# Patient Record
Sex: Female | Born: 1950 | Race: Black or African American | Hispanic: No | Marital: Single | Smoking: Never smoker
Health system: Southern US, Community
[De-identification: ages and names within clinical notes are randomized; demographics above are authoritative.]

## PROBLEM LIST (undated history)

## (undated) DIAGNOSIS — C801 Malignant (primary) neoplasm, unspecified: Secondary | ICD-10-CM

## (undated) DIAGNOSIS — Z9889 Other specified postprocedural states: Secondary | ICD-10-CM

## (undated) HISTORY — PX: APPENDECTOMY: SHX54

## (undated) HISTORY — PX: BREAST SURGERY: SHX581

---

## 2011-12-01 ENCOUNTER — Encounter (HOSPITAL_COMMUNITY): Payer: Self-pay | Admitting: *Deleted

## 2011-12-01 ENCOUNTER — Emergency Department (HOSPITAL_COMMUNITY): Payer: 59

## 2011-12-01 ENCOUNTER — Observation Stay (HOSPITAL_COMMUNITY)
Admission: EM | Admit: 2011-12-01 | Discharge: 2011-12-02 | Disposition: A | Discharge status: Home / Self Care | Payer: 59 | Attending: Emergency Medicine | Admitting: Emergency Medicine

## 2011-12-01 DIAGNOSIS — R0602 Shortness of breath: Secondary | ICD-10-CM | POA: Insufficient documentation

## 2011-12-01 DIAGNOSIS — M6281 Muscle weakness (generalized): Secondary | ICD-10-CM | POA: Insufficient documentation

## 2011-12-01 DIAGNOSIS — R079 Chest pain, unspecified: Principal | ICD-10-CM | POA: Insufficient documentation

## 2011-12-01 DIAGNOSIS — Z9889 Other specified postprocedural states: Secondary | ICD-10-CM | POA: Insufficient documentation

## 2011-12-01 DIAGNOSIS — R112 Nausea with vomiting, unspecified: Secondary | ICD-10-CM | POA: Insufficient documentation

## 2011-12-01 DIAGNOSIS — E785 Hyperlipidemia, unspecified: Secondary | ICD-10-CM | POA: Insufficient documentation

## 2011-12-01 DIAGNOSIS — E119 Type 2 diabetes mellitus without complications: Secondary | ICD-10-CM | POA: Insufficient documentation

## 2011-12-01 HISTORY — DX: Other specified postprocedural states: Z98.890

## 2011-12-01 LAB — CBC
Hemoglobin: 12.8 g/dL (ref 12.0–15.0)
MCH: 27.3 pg (ref 26.0–34.0)
MCV: 86.1 fL (ref 78.0–100.0)
RBC: 4.69 MIL/uL (ref 3.87–5.11)
WBC: 8.5 10*3/uL (ref 4.0–10.5)

## 2011-12-01 LAB — POCT I-STAT TROPONIN I
Troponin i, poc: 0.01 ng/mL (ref 0.00–0.08)
Troponin i, poc: 0.01 ng/mL (ref 0.00–0.08)
Troponin i, poc: 0 ng/mL (ref 0.00–0.08)
Troponin i, poc: 0 ng/mL (ref 0.00–0.08)

## 2011-12-01 LAB — BASIC METABOLIC PANEL
CO2: 24 mEq/L (ref 19–32)
Calcium: 9.9 mg/dL (ref 8.4–10.5)
Chloride: 102 mEq/L (ref 96–112)
Creatinine, Ser: 0.6 mg/dL (ref 0.50–1.10)
Glucose, Bld: 144 mg/dL — ABNORMAL HIGH (ref 70–99)

## 2011-12-01 LAB — GLUCOSE, CAPILLARY: Glucose-Capillary: 53 mg/dL — ABNORMAL LOW (ref 70–99)

## 2011-12-01 MED ORDER — NITROGLYCERIN 0.4 MG SL SUBL
SUBLINGUAL_TABLET | SUBLINGUAL | Status: AC
  Administered 2011-12-01: 0.4 mg via SUBLINGUAL
  Filled 2011-12-01: qty 75

## 2011-12-01 MED ORDER — ACETAMINOPHEN 325 MG PO TABS
ORAL_TABLET | ORAL | Status: AC
  Filled 2011-12-01: qty 2

## 2011-12-01 MED ORDER — ACETAMINOPHEN 325 MG PO TABS
650.0000 mg | ORAL_TABLET | Freq: Once | ORAL | Status: AC
  Administered 2011-12-01: 650 mg via ORAL
  Filled 2011-12-01: qty 2

## 2011-12-01 MED ORDER — ASPIRIN 81 MG PO CHEW
324.0000 mg | CHEWABLE_TABLET | Freq: Once | ORAL | Status: AC
  Administered 2011-12-01: 324 mg via ORAL
  Filled 2011-12-01: qty 4

## 2011-12-01 MED ORDER — ACETAMINOPHEN 325 MG PO TABS
650.0000 mg | ORAL_TABLET | Freq: Once | ORAL | Status: AC
  Administered 2011-12-01: 650 mg via ORAL

## 2011-12-01 MED ORDER — ONDANSETRON HCL 4 MG/2ML IJ SOLN
INTRAMUSCULAR | Status: AC
  Administered 2011-12-01: 4 mg via INTRAVENOUS
  Filled 2011-12-01: qty 2

## 2011-12-01 MED ORDER — METOPROLOL TARTRATE 25 MG PO TABS
100.0000 mg | ORAL_TABLET | Freq: Once | ORAL | Status: AC
  Administered 2011-12-02: 100 mg via ORAL
  Filled 2011-12-01: qty 4
  Filled 2011-12-01 (×2): qty 2

## 2011-12-01 MED ORDER — NITROGLYCERIN 0.4 MG SL SUBL
0.4000 mg | SUBLINGUAL_TABLET | SUBLINGUAL | Status: AC | PRN
  Administered 2011-12-01 (×3): 0.4 mg via SUBLINGUAL

## 2011-12-01 NOTE — ED Provider Notes (Signed)
History     CSN: [id number]  Arrival date & time 12/01/11  1006   First MD Initiated Contact with Patient 12/01/11 1015      Chief Complaint  Patient presents with  . Chest Pain  . Emesis    (Consider location/radiation/quality/duration/timing/severity/associated sxs/prior treatment) The history is provided by the patient.   61 year old female with a past medical history of diabetes and hyperlipidemia who presents to the emergency department with a chief complaint of chest pain and emesis. Nausea this morning with dry heaves at home followed by 3 episodes of non-bloody emesis at work and resultant substernal chest pressure described as "better if I could just get a burp out". There was associated generalized weakness, continued nausea, and diaphoresis. She is unable to determine if the pain was exertional. Nonpleuritic. There was no radiation. A nitroglycerin by EMS with resolution of her pain. At the time of examination, patient complains of fatigue but denies further complaints. No cardiac testing and her history. No known personal cardiac disease, her risk factors listed above are diabetes and hyperlipidemia. Her family history is significant for diabetes, but she denies any known family history of coronary disease.  Past Medical History  Diagnosis Date  . Diabetes mellitus   . S/P lumpectomy, left breast     History reviewed. No pertinent past surgical history.  No family history on file.  History  Substance Use Topics  . Smoking status: Not on file  . Smokeless tobacco: Not on file  . Alcohol Use: No     Review of Systems 10 systems reviewed and are negative for acute change except as noted in the HPI.\  Allergies  Review of patient's allergies indicates no known allergies.  Home Medications   Current Outpatient Rx  Name Route Sig Dispense Refill  . GLIPIZIDE 5 MG PO TABS Oral Take 5 mg by mouth 2 (two) times daily before a meal.    . METFORMIN HCL 500 MG PO TABS  Oral Take 1,000 mg by mouth 2 (two) times daily with a meal.    . PRAVASTATIN SODIUM 20 MG PO TABS Oral Take 10 mg by mouth daily.       BP 118/61  Pulse 88  Temp 98.8 F (37.1 C) (Oral)  Resp 18  SpO2 97%  Physical Exam  Nursing note reviewed. Constitutional: She appears well-developed and well-nourished.       Vital signs are reviewed and are normal. Pt resting on stretcher in no distress, appears tired.  HENT:  Head: Normocephalic and atraumatic.       MMM  Eyes: Conjunctivae are normal. Pupils are equal, round, and reactive to light.  Neck: Neck supple.  Cardiovascular: Normal rate, regular rhythm and normal heart sounds.   No murmur heard.      Bilateral radial and DP pulses are 2+   Pulmonary/Chest: Effort normal and breath sounds normal. No respiratory distress. She has no wheezes. She has no rales.  Abdominal: Soft. Bowel sounds are normal. She exhibits no distension. There is no tenderness.  Musculoskeletal: She exhibits no edema.       Calves supple and non-tender  Neurological: She is alert.       MAEW with 5/5 strength to BUE and BLE  Skin: Skin is warm and dry. She is not diaphoretic.  Psychiatric: She has a normal mood and affect.    ED Course  Procedures (including critical care time)  Labs Reviewed  BASIC METABOLIC PANEL - Abnormal; Notable for the following:  Glucose, Bld 144 (*)     All other components within normal limits  GLUCOSE, CAPILLARY - Abnormal; Notable for the following:    Glucose-Capillary 144 (*)     All other components within normal limits  GLUCOSE, CAPILLARY - Abnormal; Notable for the following:    Glucose-Capillary 53 (*)     All other components within normal limits  POCT I-STAT TROPONIN I  CBC  POCT I-STAT TROPONIN I  POCT I-STAT TROPONIN I   Dg Chest 2 View  12/01/2011  *RADIOLOGY REPORT*  Clinical Data: Chest pain, shortness of breath.  CHEST - 2 VIEW  Comparison: None.  Findings: Low lung volumes with minimal bibasilar  atelectasis. Heart is normal size, accentuated by the low volumes.  No effusions or acute bony abnormality.  IMPRESSION: Low lung volumes, bibasilar atelectasis.  Original Report Authenticated By: Cyndie Chime, M.D.       MDM  Pt with CP, resolved PTA, no recurrence in ED. RF DM, HLD. Troponin neg x 2. EKG nml, please see attending MD note. CXR with low lung volumes, no finding to explain sx. Good improvement of both nausea and fatigue in ED. TIMI 0, HEART score 2, pt is a good candidate for chest pain protocol. BMI <35, will plan for coronary CT. Plan for test in am, will give dose of metoprolol now to test response and determine if CT will be feasible tomorrow. Low blood sugar on last check, pt given meal tray. CDU provider Pisciotta and Lauralee Evener have been given report on this patient and will continue to monitor on evening shift.  Shaaron Adler, [place] 12/01/11 1526

## 2011-12-01 NOTE — ED Notes (Signed)
Reports h/a since administration of NTG - Tylenol administered as ordered

## 2011-12-01 NOTE — ED Provider Notes (Signed)
Pt arrived in CDU comfortably for w/o of chest pain protocol pending cardiac CT in AM tomorow. Pt pain free currently. Will follow closely  2300 - Pt resting comfortably. Denies Pain, N/V, SOB  Wynetta Emery, PA-C 12/01/11 2321

## 2011-12-01 NOTE — ED Notes (Signed)
Patient is resting comfortably. 

## 2011-12-01 NOTE — ED Notes (Signed)
CBG was 177 per EMS.

## 2011-12-01 NOTE — ED Notes (Signed)
Pt. cbg 144mg 

## 2011-12-01 NOTE — Progress Notes (Signed)
2:31 PM Medical screening examination/treatment/procedure(s) were conducted as a shared visit with non-physician practitioner(s) and myself.  I personally evaluated the patient during the encounter 61 yo woman who had precordial chest pain this AM.  Her exam is good.  Her EKG and initial TNI are negative.  I advised her that she had not had a heart attack, but could potentially have narrowing of coronary arteries to produce her symptoms.  Advised to be observed in CDU OBS for chest pain protocol.

## 2011-12-01 NOTE — ED Provider Notes (Signed)
10:35 AM  Date: 12/01/2011  Rate: 83  Rhythm: normal sinus rhythm  QRS Axis: normal  Intervals: normal  ST/T Wave abnormalities: normal  Conduction Disutrbances:none  Narrative Interpretation: Normal EKG.  Old EKG Reviewed: none available    Carleene Cooper III, MD 12/01/11 [phone]

## 2011-12-01 NOTE — ED Notes (Signed)
Pain free at this time; ambulatory to restroom without event

## 2011-12-01 NOTE — ED Provider Notes (Signed)
Medical screening examination/treatment/procedure(s) were conducted as a shared visit with non-physician practitioner(s) and myself.  I personally evaluated the patient during the encounter  2:31 PM  Medical screening examination/treatment/procedure(s) were conducted as a shared visit with non-physician practitioner(s) and myself. I personally evaluated the patient during the encounter  61 yo woman who had precordial chest pain this AM. Her exam is good. Her EKG and initial TNI are negative. I advised her that she had not had a heart attack, but could potentially have narrowing of coronary arteries to produce her symptoms. Advised to be observed in CDU OBS for chest pain protocol.        Carleene Cooper III, MD 12/01/11 2102

## 2011-12-01 NOTE — ED Notes (Signed)
PA-C made aware of low CBG.

## 2011-12-01 NOTE — ED Notes (Signed)
Pt. Has c/o "dry heaving at home." At work pt. Had c/o vomiting at work and started with c/o chest pain.  Pt. Has had c/o chest pain that was 8/10 and has decreased but continues to "linger."  Pt. Received with SL Nitro and had pain relief.  Pt. Reports that she just "feels different."

## 2011-12-01 NOTE — ED Notes (Signed)
States pain remains 3/10 after NTG x1; ccm showing SR rate 88 without ectopy

## 2011-12-02 ENCOUNTER — Observation Stay (HOSPITAL_COMMUNITY): Payer: 59

## 2011-12-02 ENCOUNTER — Observation Stay (HOSPITAL_COMMUNITY): Admit: 2011-12-02 | Payer: 59

## 2011-12-02 LAB — GLUCOSE, CAPILLARY: Glucose-Capillary: 187 mg/dL — ABNORMAL HIGH (ref 70–99)

## 2011-12-02 MED ORDER — NITROGLYCERIN 0.4 MG SL SUBL
SUBLINGUAL_TABLET | SUBLINGUAL | Status: AC
  Administered 2011-12-02: 0.4 mg via SUBLINGUAL
  Filled 2011-12-02: qty 25

## 2011-12-02 MED ORDER — METOPROLOL TARTRATE 1 MG/ML IV SOLN
5.0000 mg | Freq: Once | INTRAVENOUS | Status: AC
  Administered 2011-12-02: 5 mg via INTRAVENOUS

## 2011-12-02 MED ORDER — METOPROLOL TARTRATE 25 MG PO TABS
100.0000 mg | ORAL_TABLET | Freq: Once | ORAL | Status: AC
  Administered 2011-12-02: 100 mg via ORAL
  Filled 2011-12-02: qty 4

## 2011-12-02 MED ORDER — NITROGLYCERIN 0.4 MG SL SUBL
0.4000 mg | SUBLINGUAL_TABLET | Freq: Once | SUBLINGUAL | Status: AC
  Administered 2011-12-02: 0.4 mg via SUBLINGUAL

## 2011-12-02 MED ORDER — METOPROLOL TARTRATE 1 MG/ML IV SOLN
INTRAVENOUS | Status: AC
  Administered 2011-12-02: 5 mg via INTRAVENOUS
  Filled 2011-12-02: qty 15

## 2011-12-02 MED ORDER — METOPROLOL TARTRATE 1 MG/ML IV SOLN
10.0000 mg | Freq: Once | INTRAVENOUS | Status: AC
  Administered 2011-12-02: 10 mg via INTRAVENOUS

## 2011-12-02 MED ORDER — IOHEXOL 350 MG/ML SOLN
80.0000 mL | Freq: Once | INTRAVENOUS | Status: AC | PRN
  Administered 2011-12-02: 80 mL via INTRAVENOUS

## 2011-12-02 MED ORDER — ONDANSETRON HCL 4 MG/2ML IJ SOLN
4.0000 mg | Freq: Once | INTRAMUSCULAR | Status: AC
  Administered 2011-12-02: 4 mg via INTRAVENOUS
  Filled 2011-12-02: qty 2

## 2011-12-02 MED ORDER — MORPHINE SULFATE 4 MG/ML IJ SOLN
4.0000 mg | Freq: Once | INTRAMUSCULAR | Status: AC
  Administered 2011-12-02: 4 mg via INTRAVENOUS
  Filled 2011-12-02: qty 1

## 2011-12-02 NOTE — ED Notes (Signed)
Patient is resting comfortably. 

## 2011-12-02 NOTE — ED Provider Notes (Signed)
8:03 AM Patient is in CDU under observation, chest pain protocol.  This is a shared visit with Dr Ignacia Palma.  Patient presented with CP, vomiting.  PMH hyperlipidemia, DM.  Pt denies CP currently, states she had a headache overnight that has now resolved.  On exam, pt is A&Ox4, NAD, RRR, no m/r/g, CTAB, abd soft, NT, extremities without edema, distal pulses intact and equal bilaterally.  Plan is for Coronary CT this morning.  PC[facility] is [facility].  11:03 AM Patient is asymptomatic at present.  Coronary CT shows no CAD.  Discussed results with patient and family.  Pt to be d/c home, PCP follow up.  Patient verbalizes understanding and agrees with plan.     Results for orders placed during the hospital encounter of 12/01/11  BASIC METABOLIC PANEL      Component Value Range   Sodium 138  135 - 145 mEq/L   Potassium 4.0  3.5 - 5.1 mEq/L   Chloride 102  96 - 112 mEq/L   CO2 24  19 - 32 mEq/L   Glucose, Bld 144 (*) 70 - 99 mg/dL   BUN 14  6 - 23 mg/dL   Creatinine, Ser 4.54  0.50 - 1.10 mg/dL   Calcium 9.9  8.4 - 09.8 mg/dL   GFR calc non Af Amer >90  >90 mL/min   GFR calc Af Amer >90  >90 mL/min  GLUCOSE, CAPILLARY      Component Value Range   Glucose-Capillary 144 (*) 70 - 99 mg/dL  POCT I-STAT TROPONIN I      Component Value Range   Troponin i, poc 0.01  0.00 - 0.08 ng/mL   Comment 3           CBC      Component Value Range   WBC 8.5  4.0 - 10.5 K/uL   RBC 4.69  3.87 - 5.11 MIL/uL   Hemoglobin 12.8  12.0 - 15.0 g/dL   HCT 11.9  14.7 - 82.9 %   MCV 86.1  78.0 - 100.0 fL   MCH 27.3  26.0 - 34.0 pg   MCHC 31.7  30.0 - 36.0 g/dL   RDW 56.2  13.0 - 86.5 %   Platelets 234  150 - 400 K/uL  POCT I-STAT TROPONIN I      Component Value Range   Troponin i, poc 0.01  0.00 - 0.08 ng/mL   Comment 3           POCT I-STAT TROPONIN I      Component Value Range   Troponin i, poc 0.00  0.00 - 0.08 ng/mL   Comment 3           GLUCOSE, CAPILLARY      Component Value Range   Glucose-Capillary 53 (*) 70 - 99 mg/dL  POCT I-STAT TROPONIN I      Component Value Range   Troponin i, poc 0.00  0.00 - 0.08 ng/mL   Comment 3            Dg Chest 2 View  12/01/2011  *RADIOLOGY REPORT*  Clinical Data: Chest pain, shortness of breath.  CHEST - 2 VIEW  Comparison: None.  Findings: Low lung volumes with minimal bibasilar atelectasis. Heart is normal size, accentuated by the low volumes.  No effusions or acute bony abnormality.  IMPRESSION: Low lung volumes, bibasilar atelectasis.  Original Report Authenticated By: Cyndie Chime, M.D.   Ct Coronary Morp W/cta Cor W/score W/ca W/cm &/or Wo/cm  12/02/2011  *RADIOLOGY REPORT*  INDICATION:  Chest pain.  Coronary artery disease.  CT ANGIOGRAPHY OF THE HEART, CORONARY ARTERY, STRUCTURE, AND MORPHOLOGY  CONTRAST: 80mL OMNIPAQUE IOHEXOL 350 MG/ML SOLN  COMPARISON:  Chest radiograph 12/01/2011.  TECHNIQUE:  CT angiography of the coronary vessels was performed on a 256 channel system using prospective ECG gating.  A scout and noncontrast exam (for calcium scoring) were performed.  Circulation time was measured using a test bolus.  Coronary CTA was performed with sub mm slice collimation during portions of the cardiac cycle after prior injection of iodinated contrast.  Imaging post processing was performed on an independent workstation creating multiplanar and 3-D images, and quantitative analysis of the heart and coronary arteries.  Note that this exam targets the heart and the chest was not imaged in its entirety.  PREMEDICATION: Lopressor 200 mg, P.O. Lopressor one mg, IV Nitroglycerin 0.4 mcg, sublingual.  FINDINGS: Technical quality:  Excellent  Heart rate:  59  CORONARY ARTERIES: Left main coronary artery:  Widely patent. Left anterior descending:  Tiny area of myocardial bridging is present in the mid to distal left anterior descending coronary artery.  No stenosis.  Left anterior descending is widely patent. Left circumflex:  Diminutive but  widely patent. Right coronary artery:  Widely patent. Posterior descending artery:  Large PDA is widely patent. Dominance:  Right  CORONARY CALCIUM: Total Agatston Score:  Zero  CARDIAC MEASUREMENTS: Interventricular septum (6 - 12 mm):  10 mm LV posterior wall (6 - 12 mm):  9 mm LV diameter in diastole (35 - 52 mm):  45 mm  AORTA AND PULMONARY MEASUREMENTS: Aortic root (21 - 40 mm):             20 mm  at the annulus             32 mm  at the sinuses of Valsalva              22 mm  at the sinotubular junction Ascending aorta ( <  40 mm):  31 mm Descending aorta ( <  40 mm):  22 mm Main pulmonary artery:  ( <  30 mm):  22 mm  EXTRACARDIAC FINDINGS: Mild thoracic spondylosis.  No aggressive osseous lesions. Visualized pulmonary arteries appear within normal limits.  No pericardial effusion.  Incidental imaging the upper abdomen is normal.  Normal appearance of the distal thoracic esophagus. Lingular and dependent atelectasis in the lungs.  No airspace disease.  IMPRESSION: 1.  No coronary artery disease.  The patient's total coronary artery calcium score is zero. 2.  Coronary arteries widely patent. 3.  Right coronary artery dominance.  Report was called to [facility], [place] at 1052 hours on 12/02/2011.  Original Report Authenticated By: Andreas Newport, M.D.      Rise Patience, [place] 12/02/11 1104   **Repeat CBG was 187 prior to discharge.  Rise Patience, [place] 12/02/11 1106

## 2011-12-02 NOTE — Discharge Instructions (Signed)
Read the information below.  Please call your primary care provider today to schedule a close follow up appointment.  You may return to the ER at any time for worsening condition or any new symptoms that concern you.

## 2011-12-02 NOTE — ED Provider Notes (Signed)
Medical screening examination/treatment/procedure(s) were conducted as a shared visit with non-physician practitioner(s) and myself.  I personally evaluated the patient during the encounter Pt seen by me yesterday, placed in Chest Pain protocol.  Her coronary CT was negative, so pt was released to followup with her PCP.  Carleene Cooper III, MD 12/02/11 [phone]

## 2011-12-02 NOTE — Progress Notes (Signed)
Observation review is complete. 

## 2011-12-02 NOTE — ED Notes (Signed)
Vital signs stable. 

## 2011-12-02 NOTE — ED Notes (Signed)
SPOKE WITH DR Carlota Raspberry ABOUT PT HEART RATE. HE HAS ORDERED REPEAT METOPTOLOL FOR PT TO ACHIEVE TARGET HEART RATE FOR HER CT CARD.

## 2011-12-02 NOTE — ED Provider Notes (Signed)
Medical screening examination/treatment/procedure(s) were performed by non-physician practitioner and as supervising physician I was immediately available for consultation/collaboration.   Lark Runk A Stefano Trulson, MD 12/02/11 0002 

## 2016-07-22 ENCOUNTER — Encounter (HOSPITAL_BASED_OUTPATIENT_CLINIC_OR_DEPARTMENT_OTHER): Payer: Self-pay | Admitting: *Deleted

## 2016-07-22 ENCOUNTER — Emergency Department (HOSPITAL_BASED_OUTPATIENT_CLINIC_OR_DEPARTMENT_OTHER)
Admission: EM | Admit: 2016-07-22 | Discharge: 2016-07-22 | Disposition: A | Discharge status: Home / Self Care | Payer: 59 | Attending: Emergency Medicine | Admitting: Emergency Medicine

## 2016-07-22 ENCOUNTER — Emergency Department (HOSPITAL_BASED_OUTPATIENT_CLINIC_OR_DEPARTMENT_OTHER): Payer: 59

## 2016-07-22 DIAGNOSIS — Z79899 Other long term (current) drug therapy: Secondary | ICD-10-CM | POA: Insufficient documentation

## 2016-07-22 DIAGNOSIS — Z7984 Long term (current) use of oral hypoglycemic drugs: Secondary | ICD-10-CM | POA: Insufficient documentation

## 2016-07-22 DIAGNOSIS — Z859 Personal history of malignant neoplasm, unspecified: Secondary | ICD-10-CM | POA: Insufficient documentation

## 2016-07-22 DIAGNOSIS — R1012 Left upper quadrant pain: Secondary | ICD-10-CM | POA: Diagnosis present

## 2016-07-22 DIAGNOSIS — R110 Nausea: Secondary | ICD-10-CM | POA: Diagnosis not present

## 2016-07-22 DIAGNOSIS — E119 Type 2 diabetes mellitus without complications: Secondary | ICD-10-CM | POA: Insufficient documentation

## 2016-07-22 DIAGNOSIS — R1013 Epigastric pain: Secondary | ICD-10-CM | POA: Diagnosis not present

## 2016-07-22 HISTORY — DX: Malignant (primary) neoplasm, unspecified: C80.1

## 2016-07-22 LAB — URINALYSIS, ROUTINE W REFLEX MICROSCOPIC
Bilirubin Urine: NEGATIVE
Hgb urine dipstick: NEGATIVE
Ketones, ur: 15 mg/dL — AB
LEUKOCYTES UA: NEGATIVE
NITRITE: NEGATIVE
PH: 5 (ref 5.0–8.0)
Protein, ur: NEGATIVE mg/dL

## 2016-07-22 LAB — CBC WITH DIFFERENTIAL/PLATELET
BASOS PCT: 0 %
Basophils Absolute: 0 10*3/uL (ref 0.0–0.1)
EOS ABS: 0.1 10*3/uL (ref 0.0–0.7)
Eosinophils Relative: 1 %
HEMATOCRIT: 42.3 % (ref 36.0–46.0)
Hemoglobin: 13.5 g/dL (ref 12.0–15.0)
LYMPHS ABS: 1.1 10*3/uL (ref 0.7–4.0)
Lymphocytes Relative: 9 %
MCH: 27.3 pg (ref 26.0–34.0)
MCHC: 31.9 g/dL (ref 30.0–36.0)
MCV: 85.6 fL (ref 78.0–100.0)
MONOS PCT: 5 %
Monocytes Absolute: 0.6 10*3/uL (ref 0.1–1.0)
NEUTROS ABS: 10.6 10*3/uL — AB (ref 1.7–7.7)
NEUTROS PCT: 85 %
Platelets: 357 10*3/uL (ref 150–400)
RBC: 4.94 MIL/uL (ref 3.87–5.11)
RDW: 13.4 % (ref 11.5–15.5)
WBC: 12.3 10*3/uL — ABNORMAL HIGH (ref 4.0–10.5)

## 2016-07-22 LAB — COMPREHENSIVE METABOLIC PANEL
ALBUMIN: 4.2 g/dL (ref 3.5–5.0)
ALK PHOS: 53 U/L (ref 38–126)
ALT: 13 U/L — ABNORMAL LOW (ref 14–54)
ANION GAP: 8 (ref 5–15)
AST: 13 U/L — ABNORMAL LOW (ref 15–41)
BILIRUBIN TOTAL: 0.7 mg/dL (ref 0.3–1.2)
BUN: 16 mg/dL (ref 6–20)
CALCIUM: 9.5 mg/dL (ref 8.9–10.3)
CO2: 26 mmol/L (ref 22–32)
CREATININE: 0.58 mg/dL (ref 0.44–1.00)
Chloride: 102 mmol/L (ref 101–111)
GFR calc Af Amer: >60 mL/min (ref >60)
GFR calc non Af Amer: >60 mL/min (ref >60)
GLUCOSE: 216 mg/dL — AB (ref 65–99)
Potassium: 3.7 mmol/L (ref 3.5–5.1)
SODIUM: 136 mmol/L (ref 135–145)
TOTAL PROTEIN: 8 g/dL (ref 6.5–8.1)

## 2016-07-22 LAB — CBG MONITORING, ED: Glucose-Capillary: 191 mg/dL — ABNORMAL HIGH (ref 65–99)

## 2016-07-22 LAB — URINALYSIS, MICROSCOPIC (REFLEX)

## 2016-07-22 LAB — I-STAT CG4 LACTIC ACID, ED: Lactic Acid, Venous: 1.16 mmol/L (ref 0.5–1.9)

## 2016-07-22 LAB — LIPASE, BLOOD: Lipase: 38 U/L (ref 11–51)

## 2016-07-22 MED ORDER — IOPAMIDOL (ISOVUE-300) INJECTION 61%
100.0000 mL | Freq: Once | INTRAVENOUS | Status: AC | PRN
  Administered 2016-07-22: 100 mL via INTRAVENOUS

## 2016-07-22 MED ORDER — MORPHINE SULFATE (PF) 4 MG/ML IV SOLN
4.0000 mg | Freq: Once | INTRAVENOUS | Status: AC
  Administered 2016-07-22: 4 mg via INTRAVENOUS
  Filled 2016-07-22: qty 1

## 2016-07-22 MED ORDER — ONDANSETRON HCL 4 MG PO TABS: 4.0000 mg | ORAL_TABLET | Freq: Three times a day (TID) | ORAL | 0 refills | Status: AC | PRN

## 2016-07-22 MED ORDER — HYDROCODONE-ACETAMINOPHEN 5-325 MG PO TABS: 1.0000 | ORAL_TABLET | ORAL | 0 refills | Status: AC | PRN

## 2016-07-22 MED ORDER — SODIUM CHLORIDE 0.9 % IV BOLUS (SEPSIS)
1000.0000 mL | Freq: Once | INTRAVENOUS | Status: AC
  Administered 2016-07-22: 1000 mL via INTRAVENOUS

## 2016-07-22 MED ORDER — ONDANSETRON HCL 4 MG/2ML IJ SOLN
4.0000 mg | Freq: Once | INTRAMUSCULAR | Status: AC
  Administered 2016-07-22: 4 mg via INTRAVENOUS
  Filled 2016-07-22: qty 2

## 2016-07-22 MED FILL — HYDROCODON-APAP 5-325: 5-325 | 2 days supply | Qty: 10 | Fill #0

## 2016-07-22 MED FILL — ONDANSETRON HCL 4 MG TABLET: 4 | 4 days supply | Qty: 12 | Fill #0

## 2016-07-22 NOTE — ED Notes (Signed)
ED Provider at bedside. 

## 2016-07-22 NOTE — ED Provider Notes (Signed)
[facility] Provider Note   CSN: [id number] Arrival date & time: 07/22/16  1137     History   Chief Complaint Chief Complaint  Patient presents with  . Abdominal Pain    HPI Cleone Thurgood is a 66 y.o. female with past medical history significant for breast cancer and diabetes who presents with abdominal pain. Patient reports that she began having abdominal pain today in her left upper quadrant. She describes it as bloating, sharp, and cramping. She reports decreased appetite. She reports having to normal bowel movements today since the pain started. She denies fevers, chills, chest pain, shortness of breath, or changing urination. She reports nausea but no vomiting. She reports no URI symptoms. She describes the pain as moderate to severe and intermittent. She has not taken any medicine to help her symptoms. She denies the pain radiating. She feels bloated. She thinks it may be constipation or gas pain but due to severity, requested evaluation.      HPI  Past Medical History:  Diagnosis Date  . Cancer ([place])   . Diabetes mellitus   . S/P lumpectomy, left breast     Patient Active Problem List   Diagnosis Date Noted  . S/P lumpectomy, left breast     Past Surgical History:  Procedure Laterality Date  . APPENDECTOMY    . BREAST SURGERY    . CESAREAN SECTION      OB History    No data available       Home Medications    Prior to Admission medications   Medication Sig Start Date End Date Taking? Authorizing Provider  Canagliflozin (INVOKANA PO) Take by mouth.   Yes Historical Provider, MD  glipiZIDE (GLUCOTROL) 5 MG tablet Take 5 mg by mouth 2 (two) times daily before a meal.    Historical Provider, MD  metFORMIN (GLUCOPHAGE) 500 MG tablet Take 1,000 mg by mouth 2 (two) times daily with a meal.    Historical Provider, MD  pravastatin (PRAVACHOL) 20 MG tablet Take 10 mg by mouth daily.     Historical Provider, MD    Family History No family history on  file.  Social History Social History  Substance Use Topics  . Smoking status: Never Smoker  . Smokeless tobacco: Never Used  . Alcohol use No     Allergies   Patient has no known allergies.   Review of Systems Review of Systems  Constitutional: Positive for appetite change. Negative for activity change, chills, diaphoresis, fatigue and fever.  HENT: Negative for congestion and rhinorrhea.   Eyes: Negative for visual disturbance.  Respiratory: Negative for cough, chest tightness, shortness of breath and stridor.   Cardiovascular: Negative for chest pain, palpitations and leg swelling.  Gastrointestinal: Positive for abdominal pain and nausea. Negative for abdominal distention, constipation, diarrhea and vomiting.  Genitourinary: Negative for difficulty urinating, dysuria, flank pain, frequency, hematuria, menstrual problem, pelvic pain, vaginal bleeding and vaginal discharge.  Musculoskeletal: Negative for back pain and neck pain.  Skin: Negative for rash and wound.  Neurological: Negative for dizziness, weakness, light-headedness, numbness and headaches.  Psychiatric/Behavioral: Negative for agitation and confusion.  All other systems reviewed and are negative.    Physical Exam Updated Vital Signs BP 117/64   Pulse 102   Temp 97.5 F (36.4 C) (Oral)   Resp 16   Ht 5\' 4"  (1.626 m)   Wt 134 lb (60.8 kg)   SpO2 99%   BMI 23.00 kg/m   Physical Exam  Constitutional: She  appears well-developed and well-nourished. No distress.  HENT:  Head: Normocephalic and atraumatic.  Mouth/Throat: Oropharynx is clear and moist. No oropharyngeal exudate.  Eyes: Conjunctivae are normal.  Neck: Normal range of motion. Neck supple.  Cardiovascular: Normal rate and regular rhythm.   No murmur heard. Pulmonary/Chest: Effort normal and breath sounds normal. No respiratory distress.  Abdominal: Soft. Normal appearance. There is tenderness in the epigastric area and left upper quadrant.  There is no rigidity, no rebound, no guarding and no CVA tenderness.    Musculoskeletal: She exhibits no edema or tenderness.  Neurological: She is alert.  Skin: Skin is warm and dry.  Psychiatric: She has a normal mood and affect.  Nursing note and vitals reviewed.    ED Treatments / Results  Labs (all labs ordered are listed, but only abnormal results are displayed) Labs Reviewed  URINALYSIS, ROUTINE W REFLEX MICROSCOPIC - Abnormal; Notable for the following:       Result Value   Specific Gravity, Urine >1.046 (*)    Glucose, UA >=500 (*)    Ketones, ur 15 (*)    All other components within normal limits  CBC WITH DIFFERENTIAL/PLATELET - Abnormal; Notable for the following:    WBC 12.3 (*)    Neutro Abs 10.6 (*)    All other components within normal limits  COMPREHENSIVE METABOLIC PANEL - Abnormal; Notable for the following:    Glucose, Bld 216 (*)    AST 13 (*)    ALT 13 (*)    All other components within normal limits  URINALYSIS, MICROSCOPIC (REFLEX) - Abnormal; Notable for the following:    Bacteria, UA RARE (*)    Squamous Epithelial / LPF 0-5 (*)    All other components within normal limits  CBG MONITORING, ED - Abnormal; Notable for the following:    Glucose-Capillary 191 (*)    All other components within normal limits  URINE CULTURE  LIPASE, BLOOD  I-STAT CG4 LACTIC ACID, ED  I-STAT CG4 LACTIC ACID, ED    EKG  EKG Interpretation None       Radiology Ct Abdomen Pelvis W Contrast  Result Date: 07/22/2016 CLINICAL DATA:  Acute left upper quadrant abdominal pain. EXAM: CT ABDOMEN AND PELVIS WITH CONTRAST TECHNIQUE: Multidetector CT imaging of the abdomen and pelvis was performed using the standard protocol following bolus administration of intravenous contrast. CONTRAST:  145mL ISOVUE-300 IOPAMIDOL (ISOVUE-300) INJECTION 61% COMPARISON:  None. FINDINGS: Lower chest: No acute abnormality. Hepatobiliary: No focal liver abnormality is seen. No gallstones,  gallbladder wall thickening, or biliary dilatation. Pancreas: Unremarkable. No pancreatic ductal dilatation or surrounding inflammatory changes. Spleen: Normal in size without focal abnormality. Adrenals/Urinary Tract: Adrenal glands are unremarkable. Kidneys are normal, without renal calculi, focal lesion, or hydronephrosis. Bladder is unremarkable. Stomach/Bowel: There is no evidence of bowel obstruction. Stool is noted throughout the colon. Vascular/Lymphatic: No significant vascular findings are present. No enlarged abdominal or pelvic lymph nodes. Reproductive: Calcified fibroids are noted in the uterus. Adnexal regions appear normal. Other: No abdominal wall hernia or abnormality. No abdominopelvic ascites. Musculoskeletal: Severe degenerative disc disease is noted at L5-S1. IMPRESSION: Calcified uterine fibroids. Severe degenerative disc disease is noted at L5-S1. No other abnormality seen in the abdomen or pelvis. Electronically Signed   By: Marijo Conception, M.D.   On: 07/22/2016 14:14    Procedures Procedures (including critical care time)  Medications Ordered in ED Medications  ondansetron (ZOFRAN) injection 4 mg (4 mg Intravenous Given 07/22/16 1309)  morphine 4 MG/ML  injection 4 mg (4 mg Intravenous Given 07/22/16 1309)  sodium chloride 0.9 % bolus 1,000 mL (0 mLs Intravenous Stopped 07/22/16 1443)  iopamidol (ISOVUE-300) 61 % injection 100 mL (100 mLs Intravenous Contrast Given 07/22/16 1342)     Initial Impression / Assessment and Plan / ED Course  I have reviewed the triage vital signs and the nursing notes.  Pertinent labs & imaging results that were available during my care of the patient were reviewed by me and considered in my medical decision making (see chart for details).     Brittney Little is a 66 y.o. female with past medical history significant for breast cancer and diabetes who presents with abdominal pain.  History and exam are seen above.  On exam, patient has  significant tenderness in her left upper quadrant and epigastric area. Patient's back is nontender. CVA nontender. Legs nontender and non-swollen. Lungs clear. Exam otherwise unremarkable. No rebound tenderness or guarding.  Based on exam, patient laboratory testing to look for etiology of discomfort. Patient also CT scan due to severity to look for diverticulitis.  Diagnostic workup revealed no acute cause of discomfort. There was a moderate amount of stool seen on imaging, suspect gas versus constipation as cause of discomfort. Urinalysis did not show urinary tract infection. CT did not show kidney stones. Lactic acid normal, lipase nonelevated. CBC showed mild leukocytosis of 12.3. Diagnostic testing otherwise unremarkable.  Patient felt much better after medications and fluids. Although ultimate cause of symptoms was not discovered, patient feels it appears appropriate for discharge. Patient will be given pain medicine and nausea medicine instructed to remain hydrated. Patient will follow up with PCP in several days for further management and strict return precautions were given. Patient had other questions or concerns and patient was discharged in good condition.   Final Clinical Impressions(s) / ED Diagnoses   Final diagnoses:  Left upper quadrant pain    New Prescriptions Discharge Medication List as of 07/22/2016  4:32 PM    START taking these medications   Details  HYDROcodone-acetaminophen (NORCO/VICODIN) 5-325 MG tablet Take 1 tablet by mouth every 4 (four) hours as needed., Starting Tue 07/22/2016, Print    ondansetron (ZOFRAN) 4 MG tablet Take 1 tablet (4 mg total) by mouth every 8 (eight) hours as needed., Starting Tue 07/22/2016, Print       Clinical Impression: 1. Left upper quadrant pain     Disposition: Discharge  Condition: Good  I have discussed the results, Dx and Tx plan with the pt(& family if present). He/she/they expressed understanding and agree(s) with the  plan. Discharge instructions discussed at great length. Strict return precautions discussed and pt &/or family have verbalized understanding of the instructions. No further questions at time of discharge.    Discharge Medication List as of 07/22/2016  4:32 PM    START taking these medications   Details  HYDROcodone-acetaminophen (NORCO/VICODIN) 5-325 MG tablet Take 1 tablet by mouth every 4 (four) hours as needed., Starting Tue 07/22/2016, Print    ondansetron (ZOFRAN) 4 MG tablet Take 1 tablet (4 mg total) by mouth every 8 (eight) hours as needed., Starting Tue 07/22/2016, Print        Follow Up: [facility] [phone]       Kenna Seward J Asar Evilsizer, MD 07/23/16 1049

## 2016-07-22 NOTE — Discharge Instructions (Signed)
Please follow-up with a primary care physician for further management of your abdominal pain. If symptoms acutely worsen or symptoms change, please return to the nearest emergency department. Please take your pain medicine and nausea medicine as needed to help with your symptoms and to relieve discomfort.

## 2016-07-22 NOTE — ED Triage Notes (Signed)
Left sided abdominal pain. Denies constipation. She had 2 normal BM's this am. No appetite. Bloated.

## 2016-07-23 LAB — URINE CULTURE: Culture: NO GROWTH

## 2018-03-26 IMAGING — CT CT ABD-PELV W/ CM
2 of 5 series · 16 of 46 positions shown, 18 images · IV contrast (APPLIED)
Comparison: None.

CLINICAL DATA: Acute left upper quadrant abdominal pain.

EXAM:
CT ABDOMEN AND PELVIS WITH CONTRAST
TECHNIQUE: Multidetector CT imaging of the abdomen and pelvis was performed
using the standard protocol following bolus administration of
intravenous contrast.
CONTRAST:  100mL PN4TPC-NII IOPAMIDOL (PN4TPC-NII) INJECTION 61%

[Series 3: axial st · axial · 0.86mm/px · z∈[-404,+46]mm · 13 of 101 slices shown, 15 images]
[im 6/101  soft-tissue]
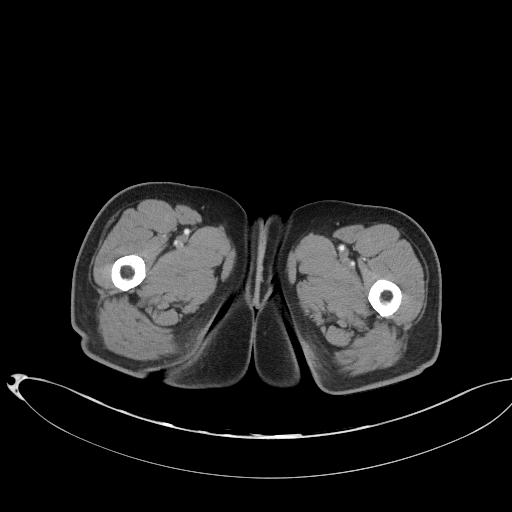
[im 6/101  bone]
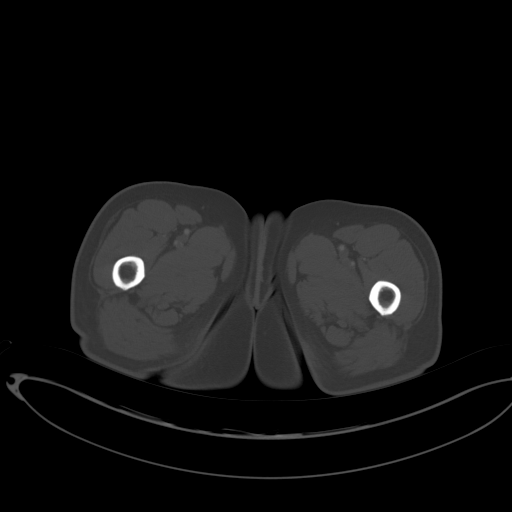
[im 16/101  soft-tissue]
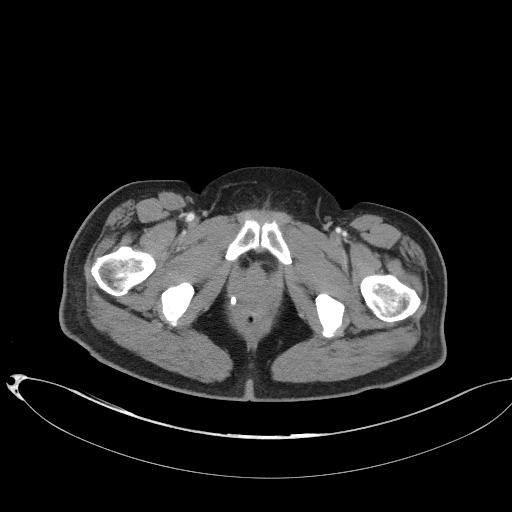
[im 21/101  soft-tissue]
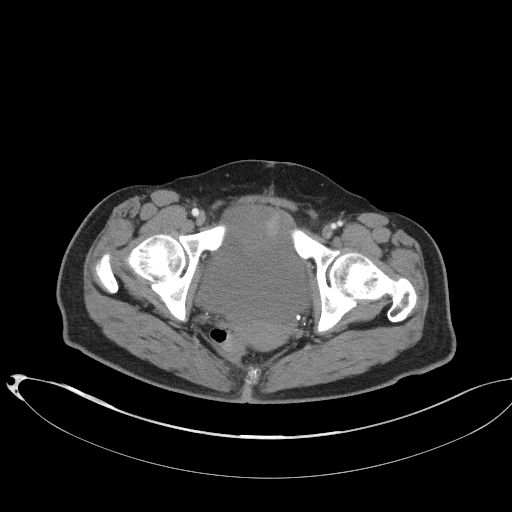
[im 31/101  soft-tissue]
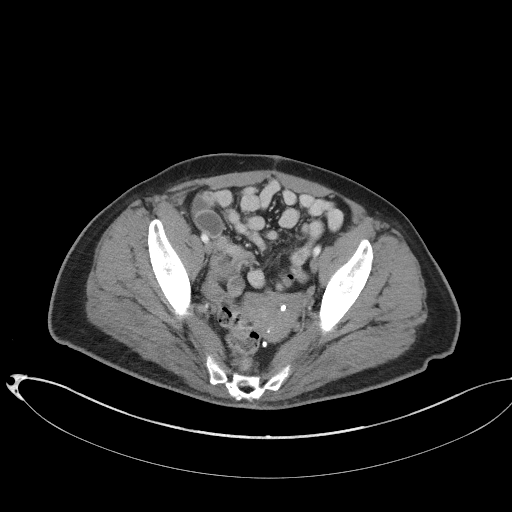
[im 36/101  soft-tissue]
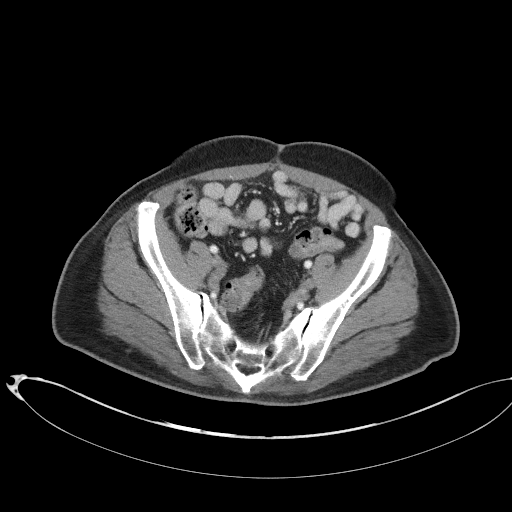
[im 46/101  soft-tissue]
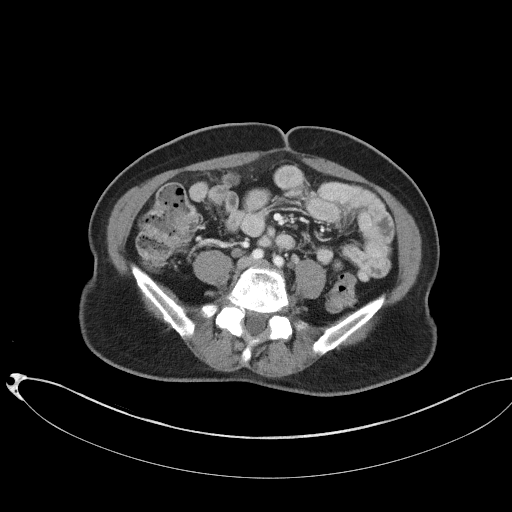
[im 51/101  soft-tissue]
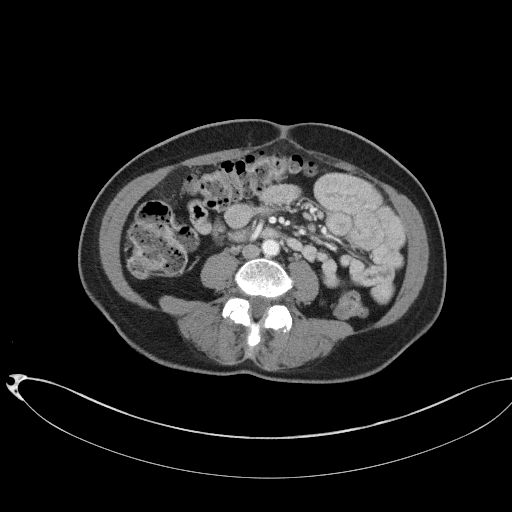
[im 56/101  soft-tissue]
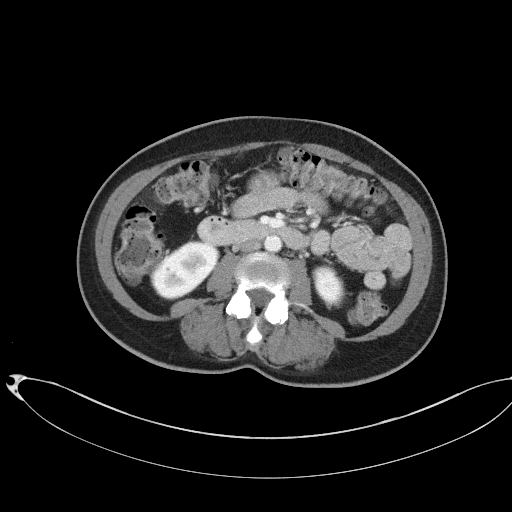
[im 66/101  soft-tissue]
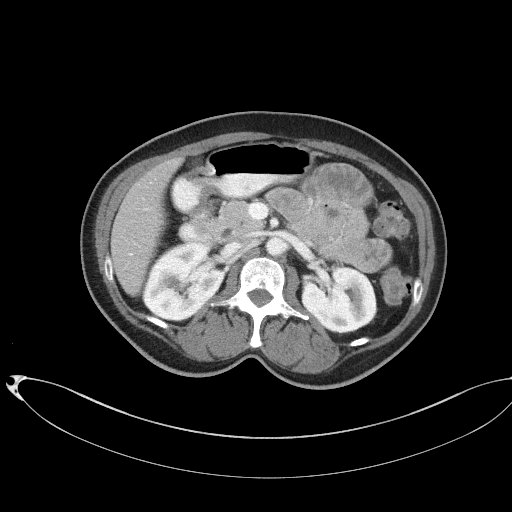
[im 66/101  bone]
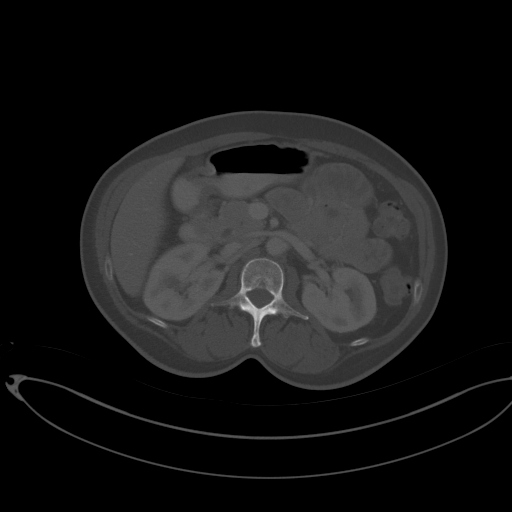
[im 71/101  soft-tissue]
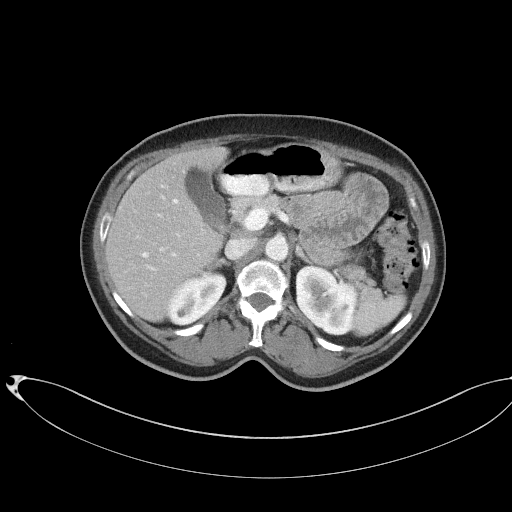
[im 81/101  soft-tissue]
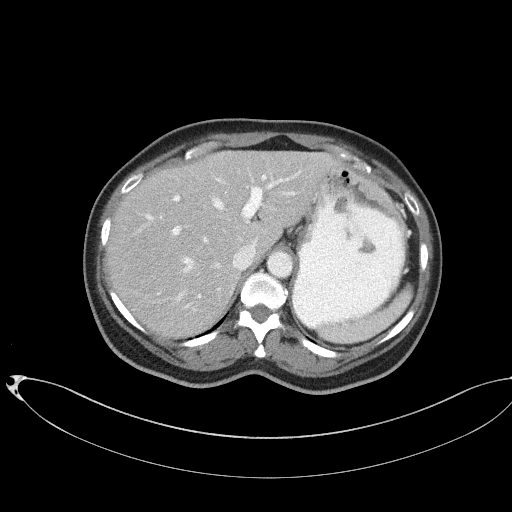
[im 86/101  soft-tissue]
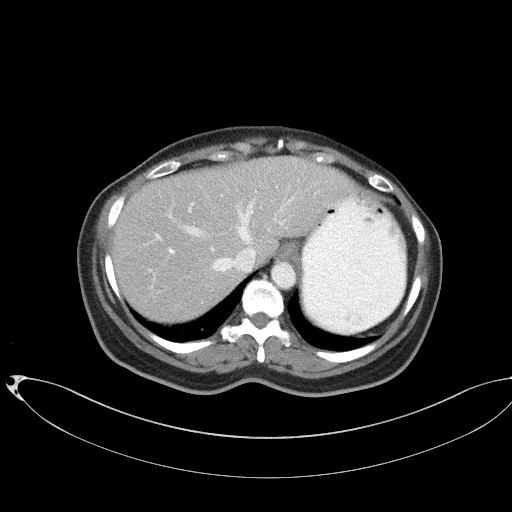
[im 96/101  soft-tissue]
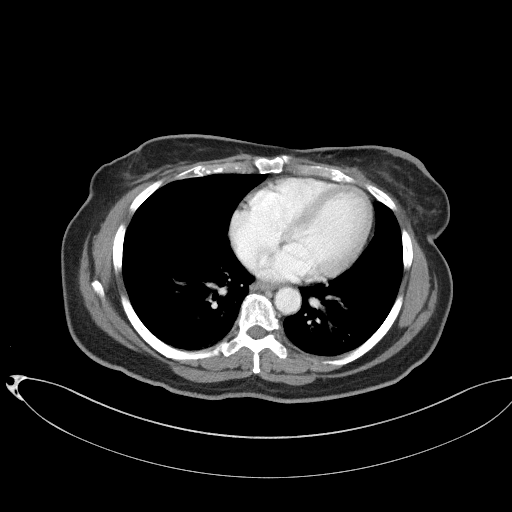

[Series 6: coronal st · coronal · 0.71mm/px · 3 of 89 slices shown]
[im 30/89  soft-tissue]
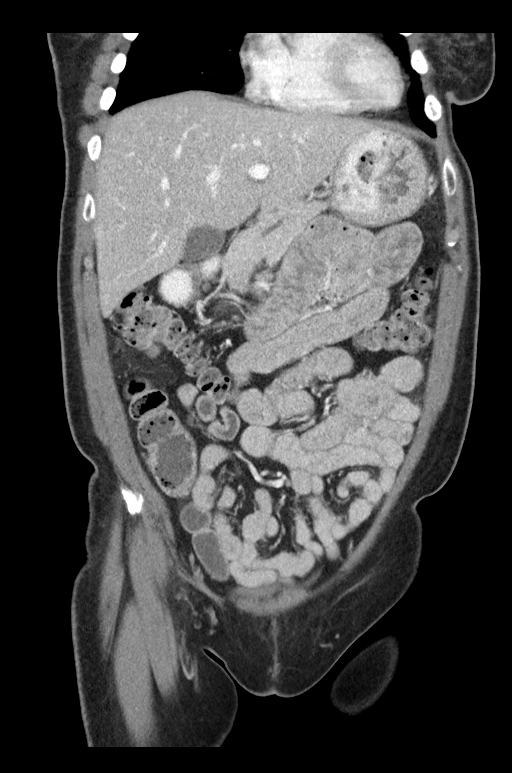
[im 40/89  soft-tissue]
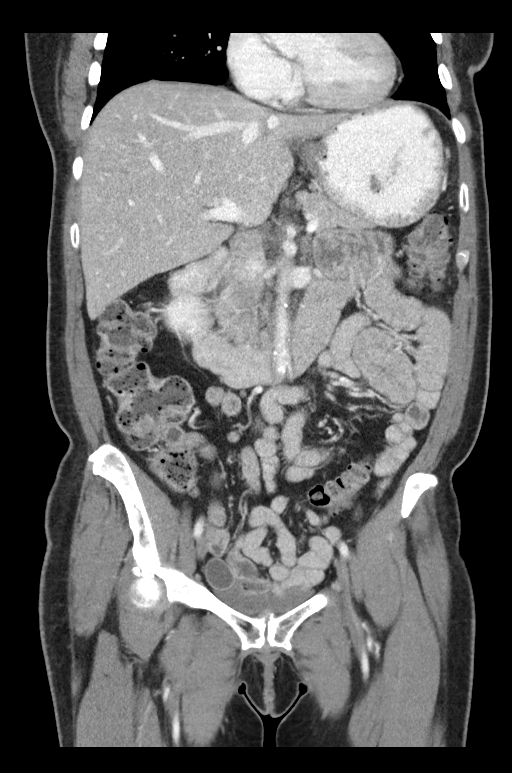
[im 49/89  soft-tissue]
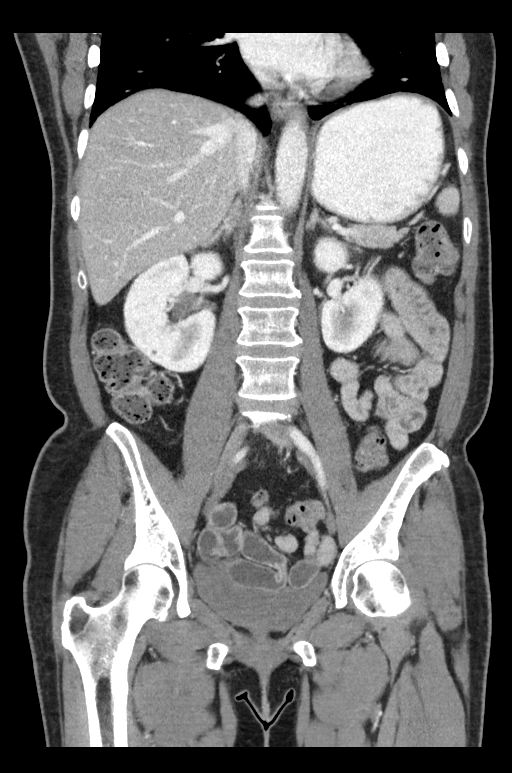

[16 of 46 positions shown; findings below may reference images not displayed]

FINDINGS: Lower chest: No acute abnormality.

Hepatobiliary: No focal liver abnormality is seen. No gallstones,
gallbladder wall thickening, or biliary dilatation.

Pancreas: Unremarkable. No pancreatic ductal dilatation or
surrounding inflammatory changes.

Spleen: Normal in size without focal abnormality.

Adrenals/Urinary Tract: Adrenal glands are unremarkable. Kidneys are
normal, without renal calculi, focal lesion, or hydronephrosis.
Bladder is unremarkable.

Stomach/Bowel: There is no evidence of bowel obstruction. Stool is
noted throughout the colon.

Vascular/Lymphatic: No significant vascular findings are present. No
enlarged abdominal or pelvic lymph nodes.

Reproductive: Calcified fibroids are noted in the uterus. Adnexal
regions appear normal.

Other: No abdominal wall hernia or abnormality. No abdominopelvic
ascites.

Musculoskeletal: Severe degenerative disc disease is noted at L5-S1.
IMPRESSION: Calcified uterine fibroids. Severe degenerative disc disease is
noted at L5-S1. No other abnormality seen in the abdomen or pelvis.

## 2023-11-17 ENCOUNTER — Ambulatory Visit
Admission: EM | Admit: 2023-11-17 | Discharge: 2023-11-17 | Disposition: A | Discharge status: Home / Self Care | Attending: Physician Assistant | Admitting: Physician Assistant

## 2023-11-17 ENCOUNTER — Other Ambulatory Visit: Payer: Self-pay

## 2023-11-17 DIAGNOSIS — H109 Unspecified conjunctivitis: Secondary | ICD-10-CM | POA: Diagnosis not present

## 2023-11-17 MED ORDER — ERYTHROMYCIN 5 MG/GM OP OINT: TOPICAL_OINTMENT | Freq: Four times a day (QID) | OPHTHALMIC | 0 refills | Status: AC

## 2023-11-17 NOTE — Discharge Instructions (Signed)
? ?  Based on your symptoms I believe that you have bacterial conjunctivitis  ?I have sent in a script for Erythromycin ophthalmic ointment - please apply a 1/2 inch strip of the ointment to your right eye every 6 hours for 7 days  ?You can use sterile eye flushes and lubricating eye drops to assist with eye irritation and further resolution ?You can also use a warm compress over the eye to assist with swelling and matting - especially in the morning  ?If you have used makeup or mascara on that eye I recommend discarding it as this can cause recurrent infection. Thoroughly wash any makeup brushes and avoid using makeup while recovering from the infection.  ?If you notice the following please return to the office: lack of improvement, eyelid swelling, increased eye irritation ?If you notice the following please go to the ED: eye pressure causing displacement of the eye, vision changes, increased eye pain or foreign body sensation, fever ? ?

## 2023-11-17 NOTE — ED Provider Notes (Signed)
 [facility]    CSN: [id number] Arrival date & time: 11/17/23  1320      History   Chief Complaint Chief Complaint  Patient presents with   Eye Drainage    HPI Brittney Little is a 73 y.o. female.   HPI   Pt reports that she is concerned for her left eye. She states when she woke up this AM it was rather crusty and then she developed some swelling  She reports itching and gritty sensation  She denies matting this AM  She reports having some discharge from the eye after washing her face this AM   She states she had some blurry vision that started last night  She does not use contacts  She denies concerns for eye contamination or foreign body today   Past Medical History:  Diagnosis Date   Cancer ([facility])    Diabetes mellitus    S/P lumpectomy, left breast     Patient Active Problem List   Diagnosis Date Noted   S/P lumpectomy, left breast     Past Surgical History:  Procedure Laterality Date   APPENDECTOMY     BREAST SURGERY     CESAREAN SECTION      OB History   No obstetric history on file.      Home Medications    Prior to Admission medications   Medication Sig Start Date End Date Taking? Authorizing Provider  erythromycin ophthalmic ointment Place into the left eye 4 (four) times daily for 7 days. Place a 1/2 inch ribbon of ointment into the lower eyelid. 11/17/23 11/24/23 Yes Jyoti Harju E, PA-C  Canagliflozin (INVOKANA PO) Take by mouth.    [provider]  glipiZIDE (GLUCOTROL) 5 MG tablet Take 5 mg by mouth 2 (two) times daily before a meal.    [provider]  HYDROcodone -acetaminophen  (NORCO/VICODIN) 5-325 MG tablet Take 1 tablet by mouth every 4 (four) hours as needed. 07/22/16   Tegeler, Marine Sia, MD  metFORMIN (GLUCOPHAGE) 500 MG tablet Take 1,000 mg by mouth 2 (two) times daily with a meal.    [provider]  ondansetron  (ZOFRAN ) 4 MG tablet Take 1 tablet (4 mg total) by mouth every 8 (eight) hours as  needed. 07/22/16   Tegeler, Marine Sia, MD  pravastatin (PRAVACHOL) 20 MG tablet Take 10 mg by mouth daily.     [provider]    Family History History reviewed. No pertinent family history.  Social History Social History   Tobacco Use   Smoking status: Never   Smokeless tobacco: Never  Vaping Use   Vaping status: Never Used  Substance Use Topics   Alcohol use: No   Drug use: No     Allergies   Patient has no known allergies.   Review of Systems Review of Systems  Eyes:  Positive for discharge, itching and visual disturbance. Negative for photophobia and pain.     Physical Exam Triage Vital Signs ED Triage Vitals  Encounter Vitals Group     BP 11/17/23 1337 125/72     Systolic BP Percentile --      Diastolic BP Percentile --      Pulse Rate 11/17/23 1337 94     Resp 11/17/23 1337 16     Temp 11/17/23 1337 98.1 F (36.7 C)     Temp Source 11/17/23 1337 Oral     SpO2 11/17/23 1337 95 %     Weight 11/17/23 1341 119 lb (54 kg)  Height 11/17/23 1341 5\' 4"  (1.626 m)     Head Circumference --      Peak Flow --      Pain Score 11/17/23 1340 4     Pain Loc --      Pain Education --      Exclude from Growth Chart --    No data found.  Updated Vital Signs BP 125/72 (BP Location: Right Arm)   Pulse 94   Temp 98.1 F (36.7 C) (Oral)   Resp 16   Ht 5\' 4"  (1.626 m)   Wt 119 lb (54 kg)   SpO2 95%   BMI 20.43 kg/m   Visual Acuity Right Eye Distance:   Left Eye Distance:   Bilateral Distance:    Right Eye Near:   Left Eye Near:    Bilateral Near:     Physical Exam Vitals reviewed.  Constitutional:      General: She is awake.     Appearance: Normal appearance. She is well-developed and well-groomed.  HENT:     Head: Normocephalic and atraumatic.  Eyes:     General: Lids are normal. Gaze aligned appropriately.        Left eye: Discharge and hordeolum present.    Extraocular Movements: Extraocular movements intact.      Conjunctiva/sclera: Conjunctivae normal.     Pupils: Pupils are equal, round, and reactive to light.     Left eye: Pupil is round, reactive and not sluggish.  Pulmonary:     Effort: Pulmonary effort is normal.  Neurological:     General: No focal deficit present.     Mental Status: She is alert and oriented to person, place, and time.     GCS: GCS eye subscore is 4. GCS verbal subscore is 5. GCS motor subscore is 6.     Cranial Nerves: No cranial nerve deficit, dysarthria or facial asymmetry.  Psychiatric:        Attention and Perception: Attention and perception normal.        Mood and Affect: Mood and affect normal.        Speech: Speech normal.        Behavior: Behavior normal. Behavior is cooperative.      UC Treatments / Results  Labs (all labs ordered are listed, but only abnormal results are displayed) Labs Reviewed - No data to display  EKG   Radiology No results found.  Procedures Procedures (including critical care time)  Medications Ordered in UC Medications - No data to display  Initial Impression / Assessment and Plan / UC Course  I have reviewed the triage vital signs and the nursing notes.  Pertinent labs & imaging results that were available during my care of the patient were reviewed by me and considered in my medical decision making (see chart for details).      Final Clinical Impressions(s) / UC Diagnoses   Final diagnoses:  Bacterial conjunctivitis of left eye   Patient presents today with concerns of left eye swelling as well as drainage that started this morning.  She reports that her eye is itchy and mildly painful.  Physical exam appears consistent with suspected bacterial conjunctivitis.  Will start erythromycin ophthalmic ointment every 6 hours for 7 days.  Reviewed that she can use warm compresses as well as lubricating eyedrops as needed for further symptomatic relief.  ED and return precautions reviewed and provided in after visit summary.   Follow-up as needed.    Discharge Instructions  Based on your symptoms I believe that you have bacterial conjunctivitis  I have sent in a script for Erythromycin ophthalmic ointment - please apply a 1/2 inch strip of the ointment to your right eye every 6 hours for 7 days  You can use sterile eye flushes and lubricating eye drops to assist with eye irritation and further resolution You can also use a warm compress over the eye to assist with swelling and matting - especially in the morning  If you have used makeup or mascara on that eye I recommend discarding it as this can cause recurrent infection. Thoroughly wash any makeup brushes and avoid using makeup while recovering from the infection.  If you notice the following please return to the office: lack of improvement, eyelid swelling, increased eye irritation If you notice the following please go to the ED: eye pressure causing displacement of the eye, vision changes, increased eye pain or foreign body sensation, fever    ED Prescriptions     Medication Sig Dispense Auth. Provider   erythromycin ophthalmic ointment Place into the left eye 4 (four) times daily for 7 days. Place a 1/2 inch ribbon of ointment into the lower eyelid. 3.5 g Jolaine Fryberger E, PA-C      PDMP not reviewed this encounter.   Perkins Molina E, PA-C 11/17/23 1907

## 2023-11-17 NOTE — ED Triage Notes (Signed)
 Pt presents with complaints of left eye swelling and drainage upon wakening this morning, 6/10. Pt currently rates her overall eye pain a 4/10. Pt states the eye itches very badly. Denies taking or applying OTC medications PTA.
